# Patient Record
Sex: Male | Born: 1985 | Race: White | Hispanic: No | Marital: Married | Smoking: Never smoker
Health system: Southern US, Community
[De-identification: ages and names within clinical notes are randomized; demographics above are authoritative.]

## PROBLEM LIST (undated history)

## (undated) DIAGNOSIS — J45909 Unspecified asthma, uncomplicated: Secondary | ICD-10-CM

## (undated) HISTORY — DX: Unspecified asthma, uncomplicated: J45.909

---
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
## 2008-04-19 ENCOUNTER — Ambulatory Visit (HOSPITAL_COMMUNITY): Admission: RE | Admit: 2008-04-19 | Discharge: 2008-04-19 | Payer: Self-pay | Admitting: Family Medicine

## 2009-08-13 IMAGING — CR DG ANKLE COMPLETE 3+V*L*
3 series · 3 of 3 positions shown · non-contrast
Comparison: None

CLINICAL DATA: Ankle pain

LEFT ANKLE COMPLETE - 3+ VIEW

[view not recorded (1 of 3)]
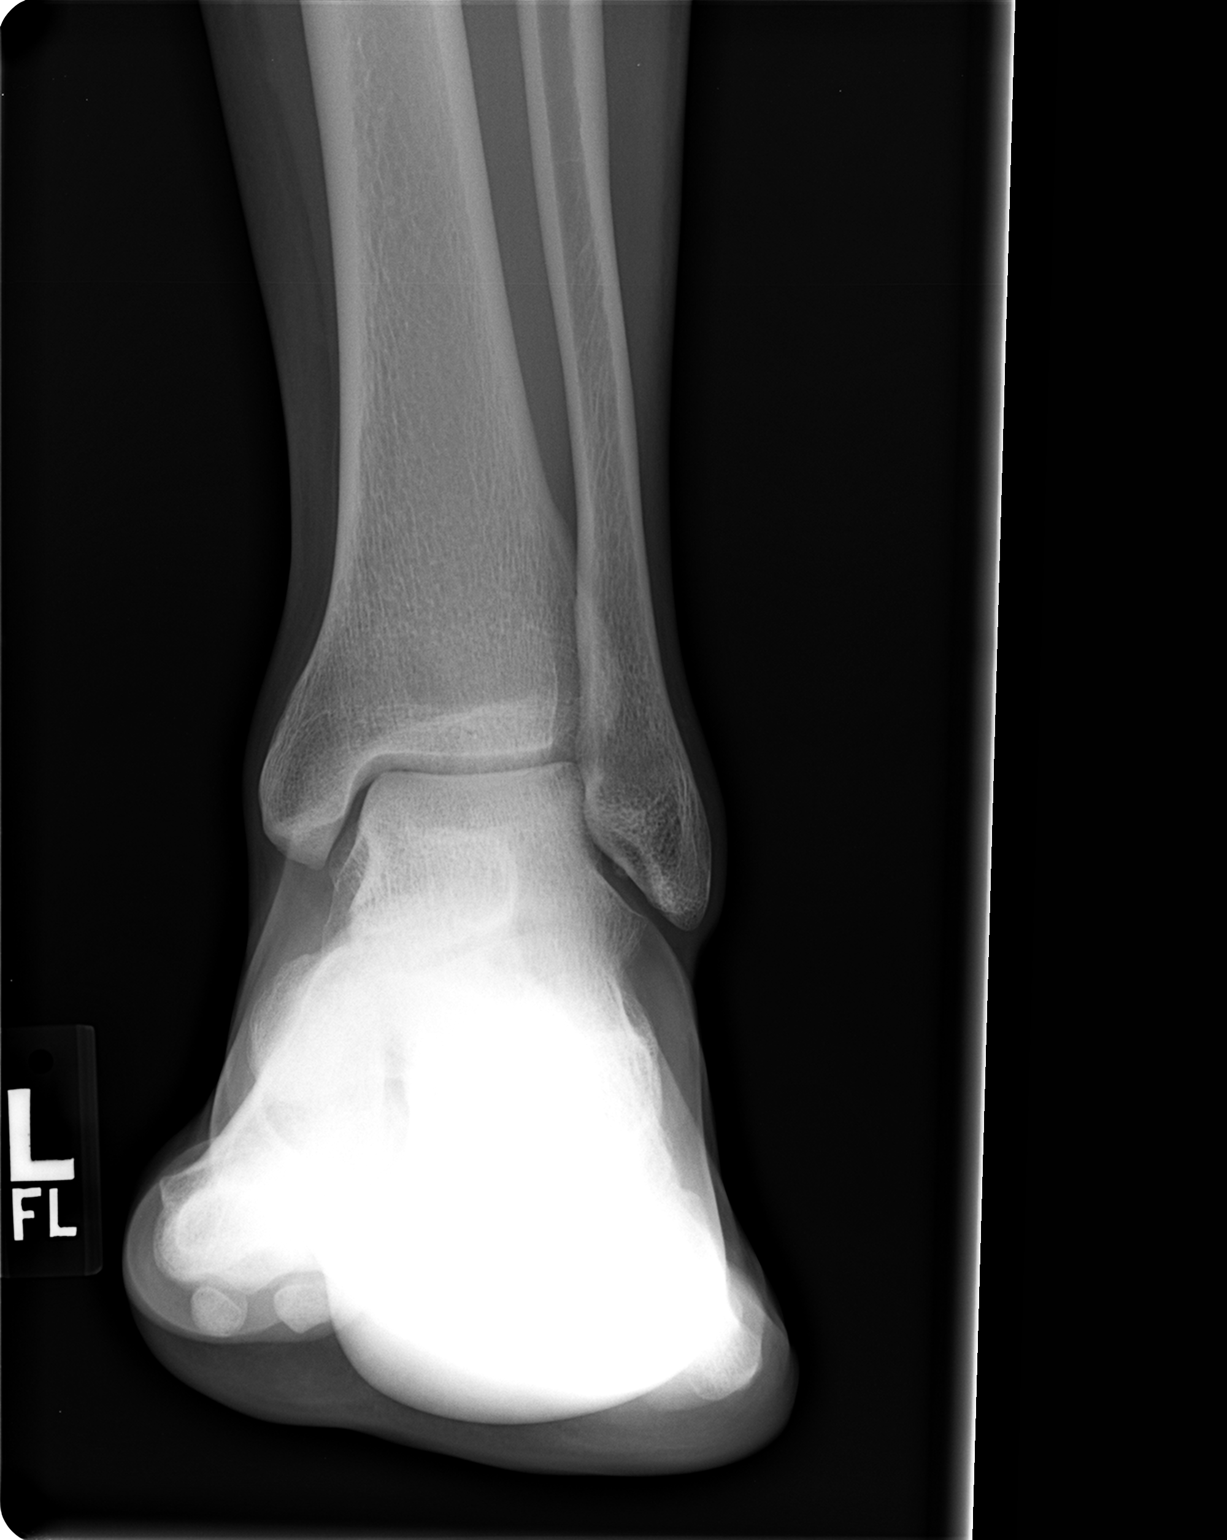

[view not recorded (2 of 3)]
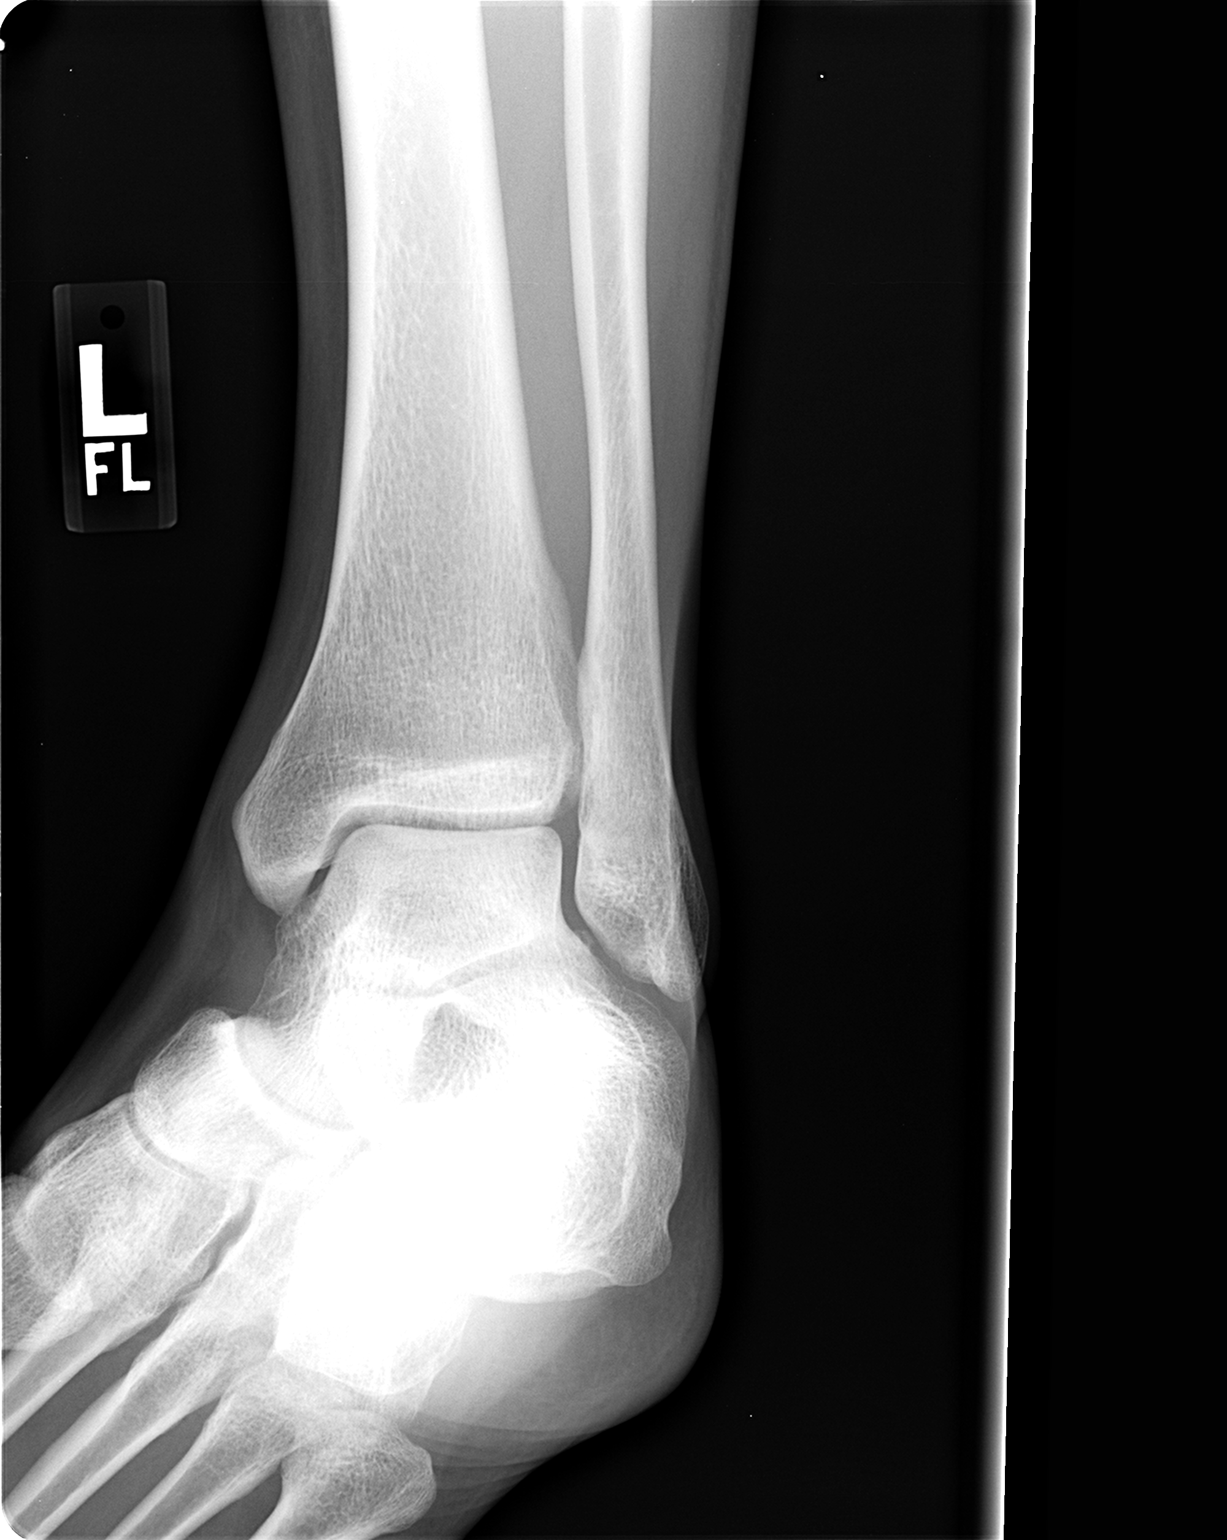

[view not recorded (3 of 3)]
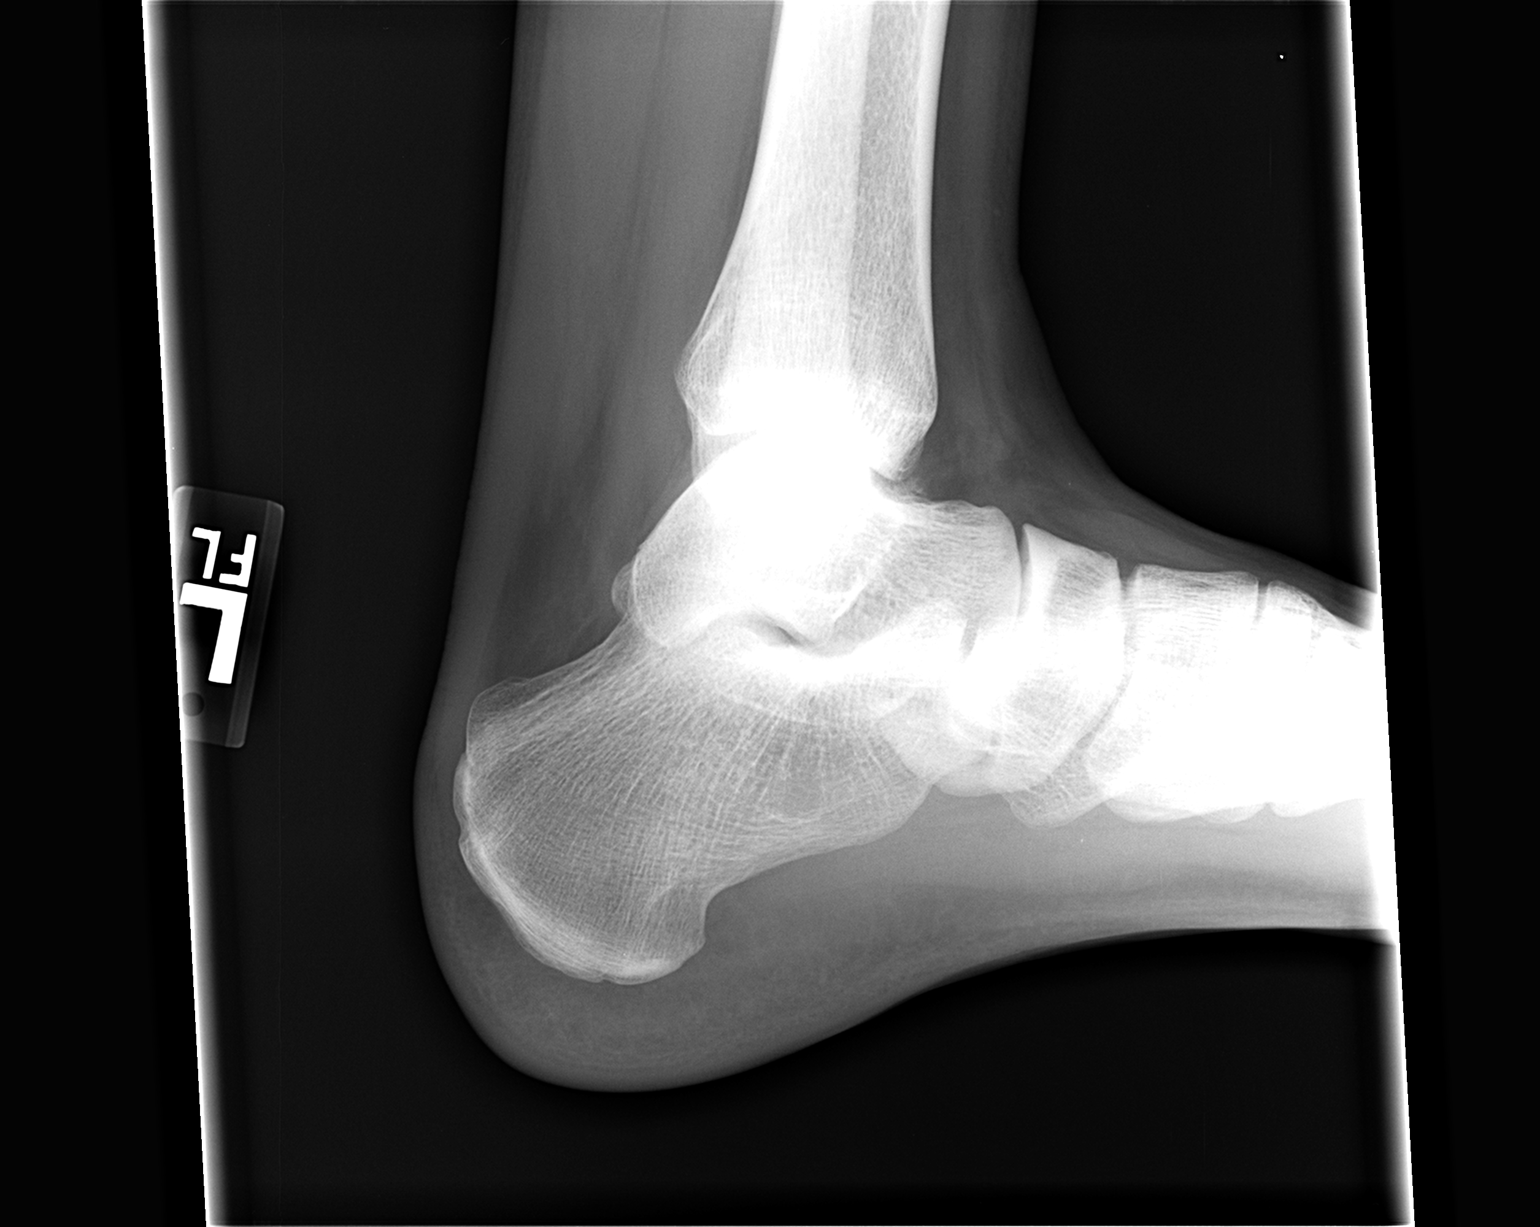

[3 of 3 positions shown; findings below may reference images not displayed]

FINDINGS: There is no evidence of fracture or dislocation.  There
is no evidence of arthropathy or other focal bone abnormality.
Soft tissues are unremarkable.
IMPRESSION: No acute findings.

## 2013-01-27 ENCOUNTER — Ambulatory Visit (INDEPENDENT_AMBULATORY_CARE_PROVIDER_SITE_OTHER): Payer: 59 | Admitting: Nurse Practitioner

## 2013-01-27 ENCOUNTER — Encounter: Payer: Self-pay | Admitting: Nurse Practitioner

## 2013-01-27 VITALS — BP 112/78 | Temp 98.5°F | Ht 70.0 in | Wt 187.0 lb

## 2013-01-27 DIAGNOSIS — J069 Acute upper respiratory infection, unspecified: Secondary | ICD-10-CM

## 2013-01-27 DIAGNOSIS — J209 Acute bronchitis, unspecified: Secondary | ICD-10-CM

## 2013-01-27 MED ORDER — AZITHROMYCIN 250 MG PO TABS: ORAL_TABLET | ORAL | Status: DC

## 2013-01-28 ENCOUNTER — Encounter: Payer: Self-pay | Admitting: Nurse Practitioner

## 2013-01-28 NOTE — Progress Notes (Signed)
Subjective:  Presents complaints of occasional cough over the past 5-6 days. No fever. Began having a headache yesterday. No wheezing. No vomiting diarrhea or abdominal pain. No sore throat or ear pain. Began having some acid reflux about 48 hours ago.  Objective:   BP 112/78  Temp(Src) 98.5 F (36.9 C) (Oral)  Ht 5\' 10"  (1.778 m)  Wt 187 lb (84.823 kg)  BMI 26.83 kg/m2 NAD. Alert, oriented. TMs clear effusion, no erythema. Pharynx mildly erythematous with PND noted. Neck supple with mild soft nontender adenopathy. Lungs scattered faint expiratory crackles, no wheezing or tachypnea. Heart regular rate rhythm. Abdomen soft nondistended nontender.  Assessment:Acute upper respiratory infections of unspecified site  Acute bronchitis  Plan: Meds ordered this encounter  Medications  . azithromycin (ZITHROMAX Z-PAK) 250 MG tablet    Sig: Take 2 tablets (500 mg) on  Day 1,  followed by 1 tablet (250 mg) once daily on Days 2 through 5.    Dispense:  6 each    Refill:  0    Order Specific Question:  Supervising Provider    Answer:  Merlyn Albert [[id number]]   OTC meds as directed for congestion. OTC med such Zantac when necessary acid reflux. Reviewed lifestyle measures affecting reflux symptoms. Call back if worsens or persists.

## 2013-02-06 ENCOUNTER — Encounter: Payer: Self-pay | Admitting: *Deleted

## 2014-05-08 ENCOUNTER — Ambulatory Visit (INDEPENDENT_AMBULATORY_CARE_PROVIDER_SITE_OTHER): Payer: 59 | Admitting: Family Medicine

## 2014-05-08 ENCOUNTER — Encounter: Payer: Self-pay | Admitting: Family Medicine

## 2014-05-08 VITALS — BP 134/92 | Temp 99.1°F | Ht 70.0 in | Wt 192.0 lb

## 2014-05-08 DIAGNOSIS — B349 Viral infection, unspecified: Secondary | ICD-10-CM

## 2014-05-08 NOTE — Progress Notes (Signed)
   Subjective:    Patient ID: Baruch GoldmannJohn W Aulds, male    DOB: 09/15/1985, 29 y.o.   MRN: [id number]  Fever  This is a new problem. Episode onset: Saturday. The problem has been gradually worsening. The maximum temperature noted was 99 to 99.9 F. The temperature was taken using an oral thermometer. Associated symptoms include coughing, headaches, muscle aches and a sore throat. Associated symptoms comments: Low back pain and leg pain. He has tried NSAIDs for the symptoms. The treatment provided mild relief.   Started sat morn, pain and aching and sore throat,  Pain rough last night. Low gr fever, cold chills, a little cough  Behind the eyes,  No flu shot this yr  Shin splint like pain  Hx of ahilles tendonitis  Doing extra time as a salesman  Advil couple here and there  Throat worse when first wake up, hard to swallow   Review of Systems  Constitutional: Positive for fever.  HENT: Positive for sore throat.   Respiratory: Positive for cough.   Neurological: Positive for headaches.       Objective:   Physical Exam  Alert moderate malaise. Hydration good. HEENT normal. Neck supple. Lungs clear. Heart regular in rhythm. Legs within normal limits.      Assessment & Plan:  Impression probable influenza discussed plan symptomatic care discussed. Warning signs discussed. WSL

## 2014-05-11 ENCOUNTER — Telehealth: Payer: Self-pay | Admitting: Family Medicine

## 2014-05-11 ENCOUNTER — Encounter: Payer: Self-pay | Admitting: Family Medicine

## 2014-05-11 ENCOUNTER — Ambulatory Visit (INDEPENDENT_AMBULATORY_CARE_PROVIDER_SITE_OTHER): Payer: 59 | Admitting: Family Medicine

## 2014-05-11 VITALS — BP 138/90 | Temp 99.4°F | Ht 70.0 in | Wt 190.0 lb

## 2014-05-11 DIAGNOSIS — J4521 Mild intermittent asthma with (acute) exacerbation: Secondary | ICD-10-CM

## 2014-05-11 DIAGNOSIS — E860 Dehydration: Secondary | ICD-10-CM

## 2014-05-11 DIAGNOSIS — J683 Other acute and subacute respiratory conditions due to chemicals, gases, fumes and vapors: Secondary | ICD-10-CM

## 2014-05-11 DIAGNOSIS — M545 Low back pain, unspecified: Secondary | ICD-10-CM

## 2014-05-11 LAB — POCT URINALYSIS DIPSTICK
PH UA: 6
Spec Grav, UA: 1.02

## 2014-05-11 MED ORDER — ALBUTEROL SULFATE HFA 108 (90 BASE) MCG/ACT IN AERS: 2.0000 | INHALATION_SPRAY | Freq: Four times a day (QID) | RESPIRATORY_TRACT | Status: AC | PRN

## 2014-05-11 MED ORDER — CLARITHROMYCIN 500 MG PO TABS: 500.0000 mg | ORAL_TABLET | Freq: Two times a day (BID) | ORAL | Status: AC

## 2014-05-11 NOTE — Telephone Encounter (Signed)
Pt is not getting any better and yesterday he is experiencing a strong odor in his urine And is urinating more frequently. Pt states that the urine is dark in color and he is having back pain. Pt wants to know if this is normal of if he needs to be seen. Pt also has a fever and is sweating a lot.

## 2014-05-11 NOTE — Progress Notes (Signed)
   Subjective:    Patient ID: Baruch GoldmannJohn W Iovino, male    DOB: 04/29/1985, 29 y.o.   MRN: [id number]  Fever  This is a new problem. Episode onset: Tuesday. The problem occurs intermittently. The problem has been gradually worsening. Associated symptoms include abdominal pain, congestion, coughing, headaches, muscle aches, nausea and a sore throat. Associated symptoms comments: Sweating, dark urine, odor to urine, low back pain. He has tried acetaminophen and NSAIDs for the symptoms. The treatment provided no relief.   Coughing badly  Sweats and feeling bad  Urine dark  Fever off and on tmax 101 Sweating and chills  Urine dark and orange  Drank a lot ofwater and urine now orange an d   Results for orders placed or performed in visit on 05/11/14  POCT urinalysis dipstick  Result Value Ref Range   Color, UA     Clarity, UA     Glucose, UA     Bilirubin, UA     Ketones, UA     Spec Grav, UA 1.020    Blood, UA     pH, UA 6.0    Protein, UA     Urobilinogen, UA     Nitrite, UA     Leukocytes, UA       Review of Systems  Constitutional: Positive for fever.  HENT: Positive for congestion and sore throat.   Respiratory: Positive for cough.   Gastrointestinal: Positive for nausea and abdominal pain.  Neurological: Positive for headaches.       Objective:   Physical Exam Alert mild malaise frontal max or tenderness pharynx normal neck supple lungs bilateral minimal wheezes no inspiratory crackles heart rare rhythm abdomen benign no back tenderness or pain X  Urinalysis no white blood cells no bacteria       Assessment & Plan:  Impression post flu sinusitis bronchitis with reactive airways and some dehydration discussed at great length. Plan 25 minutes spent most in discussion of substantial concerns about all the aspects of this patient's flu. WSL

## 2014-05-11 NOTE — Telephone Encounter (Signed)
Pt scheduled appt for today

## 2014-09-14 ENCOUNTER — Encounter: Payer: Self-pay | Admitting: Nurse Practitioner

## 2014-09-14 ENCOUNTER — Ambulatory Visit (INDEPENDENT_AMBULATORY_CARE_PROVIDER_SITE_OTHER): Payer: BLUE CROSS/BLUE SHIELD | Admitting: Nurse Practitioner

## 2014-09-14 VITALS — BP 128/86 | Temp 98.3°F | Ht 70.0 in | Wt 197.0 lb

## 2014-09-14 DIAGNOSIS — L509 Urticaria, unspecified: Secondary | ICD-10-CM | POA: Diagnosis not present

## 2014-09-14 NOTE — Progress Notes (Signed)
Subjective:  Presents for c/o rash on both arms that occurs with exposure to heat. Began a few weeks ago. Within 5 min of exposure to heat, rash will start. Will gradually fade over time. Benadryl helps. No fever. No known allergens. No known contacts. No wheezing or difficulty breathing.   Objective:   BP 128/86 mmHg  Temp(Src) 98.3 F (36.8 C) (Oral)  Ht  (1.778 m)  Wt 197 lb (89.359 kg)  BMI 28.27 kg/m2 NAD. Alert, oriented. Lungs clear. Heart RRR. A few discrete scattered pink papules on forearms. Has a picture showing multiple lesions.  Assessment:  Problem List Items Addressed This Visit      Musculoskeletal and Integument   Urticaria - Primary     Plan: Claritin 10 mg in the morning Benadryl 25 mg at night Zantac 150 mg once a day Avoid excessive exposure to heat. Warning signs reviewed. Seek care immediately for severe symptoms. Call back if worsens or persists.

## 2014-09-14 NOTE — Patient Instructions (Addendum)
Claritin 10 mg in the morning Benadryl 25 mg at night Zantac 150 mg once a day   Hives Hives are itchy, red, swollen areas of the skin. They can vary in size and location on your body. Hives can come and go for hours or several days (acute hives) or for several weeks (chronic hives). Hives do not spread from person to person (noncontagious). They may get worse with scratching, exercise, and emotional stress. CAUSES   Allergic reaction to food, additives, or drugs.  Infections, including the common cold.  Illness, such as vasculitis, lupus, or thyroid disease.  Exposure to sunlight, heat, or cold.  Exercise.  Stress.  Contact with chemicals. SYMPTOMS   Red or white swollen patches on the skin. The patches may change size, shape, and location quickly and repeatedly.  Itching.  Swelling of the hands, feet, and face. This may occur if hives develop deeper in the skin. DIAGNOSIS  Your caregiver can usually tell what is wrong by performing a physical exam. Skin or blood tests may also be done to determine the cause of your hives. In some cases, the cause cannot be determined. TREATMENT  Mild cases usually get better with medicines such as antihistamines. Severe cases may require an emergency epinephrine injection. If the cause of your hives is known, treatment includes avoiding that trigger.  HOME CARE INSTRUCTIONS   Avoid causes that trigger your hives.  Take antihistamines as directed by your caregiver to reduce the severity of your hives. Non-sedating or low-sedating antihistamines are usually recommended. Do not drive while taking an antihistamine.  Take any other medicines prescribed for itching as directed by your caregiver.  Wear loose-fitting clothing.  Keep all follow-up appointments as directed by your caregiver. SEEK MEDICAL CARE IF:   You have persistent or severe itching that is not relieved with medicine.  You have painful or swollen joints. SEEK IMMEDIATE  MEDICAL CARE IF:   You have a fever.  Your tongue or lips are swollen.  You have trouble breathing or swallowing.  You feel tightness in the throat or chest.  You have abdominal pain. These problems may be the first sign of a life-threatening allergic reaction. Call your local emergency services (911 in [place] MAKE SURE YOU:   Understand these instructions.  Will watch your condition.  Will get help right away if you are not doing well or get worse. Document Released: 02/03/2005 Document Revised: 02/08/2013 Document Reviewed: 04/29/2011 [facility] Patient Information 2015 [place], [place]. This information is not intended to replace advice given to you by your health care provider. Make sure you discuss any questions you have with your health care provider.

## 2016-02-06 ENCOUNTER — Telehealth: Payer: Self-pay | Admitting: Family Medicine

## 2016-02-06 MED ORDER — OSELTAMIVIR PHOSPHATE 75 MG PO CAPS: 75.0000 mg | ORAL_CAPSULE | Freq: Two times a day (BID) | ORAL | 0 refills | Status: AC

## 2016-02-06 NOTE — Telephone Encounter (Signed)
Pt is in [place] and is needing tamiflu sent in. Pt's child was diagnosed with flu a.   [facility] [place] [id number] 

## 2016-02-06 NOTE — Telephone Encounter (Signed)
Prescription sent electronically to pharmacy. Patient notified. 

## 2016-02-06 NOTE — Telephone Encounter (Signed)
Consult with Dr Brett CanalesSteve: Tamiflu 75 mg #10 one tablet daily for 5 days

## 2019-03-21 ENCOUNTER — Encounter: Payer: Self-pay | Admitting: Family Medicine
